# Patient Record
Sex: Female | Born: 1985 | Race: White | Hispanic: No | Marital: Single | Smoking: Never smoker
Health system: Southern US, Community
[De-identification: ages and names within clinical notes are randomized; demographics above are authoritative.]

## PROBLEM LIST (undated history)

## (undated) DIAGNOSIS — M549 Dorsalgia, unspecified: Secondary | ICD-10-CM

## (undated) DIAGNOSIS — F209 Schizophrenia, unspecified: Secondary | ICD-10-CM

## (undated) HISTORY — PX: LUMBAR DISC SURGERY: SHX700

---

## 2013-08-23 ENCOUNTER — Emergency Department (HOSPITAL_COMMUNITY): Payer: Medicaid Other

## 2013-08-23 ENCOUNTER — Encounter (HOSPITAL_COMMUNITY): Payer: Self-pay | Admitting: Emergency Medicine

## 2013-08-23 ENCOUNTER — Emergency Department (HOSPITAL_COMMUNITY)
Admission: EM | Admit: 2013-08-23 | Discharge: 2013-08-23 | Disposition: A | Discharge status: Home / Self Care | Payer: Medicaid Other | Attending: Emergency Medicine | Admitting: Emergency Medicine

## 2013-08-23 DIAGNOSIS — J069 Acute upper respiratory infection, unspecified: Secondary | ICD-10-CM | POA: Insufficient documentation

## 2013-08-23 DIAGNOSIS — Z79899 Other long term (current) drug therapy: Secondary | ICD-10-CM | POA: Insufficient documentation

## 2013-08-23 DIAGNOSIS — F209 Schizophrenia, unspecified: Secondary | ICD-10-CM | POA: Insufficient documentation

## 2013-08-23 HISTORY — DX: Dorsalgia, unspecified: M54.9

## 2013-08-23 HISTORY — DX: Schizophrenia, unspecified: F20.9

## 2013-08-23 MED ORDER — ALBUTEROL SULFATE HFA 108 (90 BASE) MCG/ACT IN AERS
2.0000 | INHALATION_SPRAY | RESPIRATORY_TRACT | Status: DC | PRN
  Administered 2013-08-23: 2 via RESPIRATORY_TRACT
  Filled 2013-08-23: qty 6.7

## 2013-08-23 MED ORDER — ALBUTEROL SULFATE (5 MG/ML) 0.5% IN NEBU
5.0000 mg | INHALATION_SOLUTION | Freq: Once | RESPIRATORY_TRACT | Status: AC
  Administered 2013-08-23: 5 mg via RESPIRATORY_TRACT
  Filled 2013-08-23: qty 1

## 2013-08-23 NOTE — ED Provider Notes (Signed)
CSN: [id number]     Arrival date & time 08/23/13  6644 History   First MD Initiated Contact with Patient 08/23/13 0601     Chief Complaint  Patient presents with  . Cough  . URI   (Consider location/radiation/quality/duration/timing/severity/associated sxs/prior Treatment) HPI Comments: Patient presents to the ED with a chief complaint of cough.  She states that she has been coughing for the past month.  She states that she thinks it is "drying out."  She has not taken anything to alleviate her symptoms.  Nothing makes her symptoms better or worse.  She endorses subjective fevers and chills, but has never recorded a temperature.  Additionally, states that she recently underwent an epidural for a herniated disc.  She states that she has had some discomfort, but thinks that it is her normal pain.  She states that she did see some pink discharge, but this has since stopped.  She denies any bowel or bladder problems.  The history is provided by the patient. No language interpreter was used.    Past Medical History  Diagnosis Date  . Back pain   . Schizophrenia    Past Surgical History  Procedure Laterality Date  . Lumbar disc surgery     Family History  Problem Relation Age of Onset  . Hypertension Other   . Cancer Other    History  Substance Use Topics  . Smoking status: Never Smoker   . Smokeless tobacco: Not on file  . Alcohol Use: No   OB History   Grav Para Term Preterm Abortions TAB SAB Ect Mult Living                 Review of Systems  All other systems reviewed and are negative.    Allergies  Review of patient's allergies indicates no known allergies.  Home Medications   Current Outpatient Rx  Name  Route  Sig  Dispense  Refill  . amphetamine-dextroamphetamine (ADDERALL XR) 20 MG 24 hr capsule   Oral   Take 20 mg by mouth daily.         . ARIPiprazole (ABILIFY) 20 MG tablet   Oral   Take 40 mg by mouth daily.         . benztropine (COGENTIN) 1 MG  tablet   Oral   Take 1 mg by mouth 2 (two) times daily as needed for tremors.         . fentaNYL (DURAGESIC - DOSED MCG/HR) 25 MCG/HR patch   Transdermal   Place 25 mcg onto the skin every 3 (three) days.         Marland Kitchen ibuprofen (ADVIL,MOTRIN) 200 MG tablet   Oral   Take 200 mg by mouth every 6 (six) hours as needed.         . pseudoephedrine-guaifenesin (MUCINEX D) 60-600 MG per tablet   Oral   Take 1 tablet by mouth every 12 (twelve) hours.         . sertraline (ZOLOFT) 100 MG tablet   Oral   Take 100 mg by mouth daily.          BP 141/83  Pulse 101  Temp(Src) 99.2 F (37.3 C) (Oral)  Resp 16  SpO2 98%  LMP 07/29/2013 Physical Exam  Nursing note and vitals reviewed. Constitutional: She is oriented to person, place, and time. She appears well-developed and well-nourished.  HENT:  Head: Normocephalic and atraumatic.  Eyes: Conjunctivae and EOM are normal. Pupils are equal, round, and reactive  to light.  Neck: Normal range of motion. Neck supple.  Cardiovascular: Normal rate and regular rhythm.  Exam reveals no gallop and no friction rub.   No murmur heard. Pulmonary/Chest: Effort normal and breath sounds normal. No respiratory distress. She has no wheezes. She has no rales. She exhibits no tenderness.  Abdominal: Soft. Bowel sounds are normal. She exhibits no distension and no mass. There is no tenderness. There is no rebound and no guarding.  Musculoskeletal: Normal range of motion. She exhibits no edema and no tenderness.  Lumbar spine mildly tender to palpation, no bony step offs or deformities  Neurological: She is alert and oriented to person, place, and time.  Skin: Skin is warm and dry.  Lumbar spine is unremarkable, no erythema, ecchymosis, or evidence of infection  Psychiatric: She has a normal mood and affect. Her behavior is normal. Judgment and thought content normal.    ED Course  Procedures (including critical care time) Labs Review Labs Reviewed  - No data to display Imaging Review Dg Chest 2 View  08/23/2013   CLINICAL DATA:  Cough and upper respiratory infection.  EXAM: CHEST  2 VIEW  COMPARISON:  None.  FINDINGS: The heart size and mediastinal contours are within normal limits. Both lungs are clear. The visualized skeletal structures are unremarkable.  IMPRESSION: No active cardiopulmonary disease.   Electronically Signed   By: Tiburcio Pea M.D.   On: 08/23/2013 06:37    EKG Interpretation   None       MDM   1. URI (upper respiratory infection)    Pt CXR negative for acute infiltrate. Patients symptoms are consistent with URI, likely viral etiology. Discussed that antibiotics are not indicated for viral infections. Pt will be discharged with symptomatic treatment.  Verbalizes understanding and is agreeable with plan. Pt is hemodynamically stable & in NAD prior to dc.  No evidence of epidural abscess.  Suspect that the tenderness is related to the recent injection, but nothing more.  No headache, fever, or paresthesias.  Patient discussed with Dr. Effie Shy, who agrees with the plan.   Roxy Horseman, PA-C 08/23/13 [phone]

## 2013-08-23 NOTE — ED Notes (Signed)
Pt states she has not been feeling well for about a month  Pt states she has a cough, congestion, fever off and on, and had vomiting twice last week  Pt states cough is worse tonight

## 2013-08-23 NOTE — ED Provider Notes (Signed)
Medical screening examination/treatment/procedure(s) were performed by non-physician practitioner and as supervising physician I was immediately available for consultation/collaboration.  Denim Kalmbach L Javen Ridings, MD 08/23/13 1713 

## 2014-09-21 ENCOUNTER — Encounter (HOSPITAL_COMMUNITY): Payer: Self-pay

## 2014-09-21 ENCOUNTER — Emergency Department (HOSPITAL_COMMUNITY)
Admission: EM | Admit: 2014-09-21 | Discharge: 2014-09-21 | Disposition: A | Discharge status: Home / Self Care | Payer: Medicaid Other | Attending: Emergency Medicine | Admitting: Emergency Medicine

## 2014-09-21 ENCOUNTER — Emergency Department (HOSPITAL_COMMUNITY): Payer: Medicaid Other

## 2014-09-21 DIAGNOSIS — R079 Chest pain, unspecified: Secondary | ICD-10-CM | POA: Diagnosis not present

## 2014-09-21 DIAGNOSIS — F209 Schizophrenia, unspecified: Secondary | ICD-10-CM | POA: Diagnosis not present

## 2014-09-21 DIAGNOSIS — G8929 Other chronic pain: Secondary | ICD-10-CM | POA: Insufficient documentation

## 2014-09-21 DIAGNOSIS — J45901 Unspecified asthma with (acute) exacerbation: Secondary | ICD-10-CM | POA: Diagnosis not present

## 2014-09-21 DIAGNOSIS — Z79899 Other long term (current) drug therapy: Secondary | ICD-10-CM | POA: Insufficient documentation

## 2014-09-21 DIAGNOSIS — J069 Acute upper respiratory infection, unspecified: Secondary | ICD-10-CM | POA: Insufficient documentation

## 2014-09-21 DIAGNOSIS — R0602 Shortness of breath: Secondary | ICD-10-CM | POA: Diagnosis present

## 2014-09-21 LAB — BASIC METABOLIC PANEL
ANION GAP: 7 (ref 5–15)
BUN: 10 mg/dL (ref 6–23)
CALCIUM: 9.4 mg/dL (ref 8.4–10.5)
CO2: 23 mmol/L (ref 19–32)
Chloride: 108 mEq/L (ref 96–112)
Creatinine, Ser: 0.62 mg/dL (ref 0.50–1.10)
GFR calc Af Amer: >90 mL/min (ref >90)
GFR calc non Af Amer: >90 mL/min (ref >90)
Glucose, Bld: 91 mg/dL (ref 70–99)
POTASSIUM: 4 mmol/L (ref 3.5–5.1)
Sodium: 138 mmol/L (ref 135–145)

## 2014-09-21 LAB — CBC
HEMATOCRIT: 40.7 % (ref 36.0–46.0)
HEMOGLOBIN: 14 g/dL (ref 12.0–15.0)
MCH: 28.8 pg (ref 26.0–34.0)
MCHC: 34.4 g/dL (ref 30.0–36.0)
MCV: 83.7 fL (ref 78.0–100.0)
PLATELETS: 344 10*3/uL (ref 150–400)
RBC: 4.86 MIL/uL (ref 3.87–5.11)
RDW: 13.1 % (ref 11.5–15.5)
WBC: 5.7 10*3/uL (ref 4.0–10.5)

## 2014-09-21 LAB — I-STAT TROPONIN, ED: TROPONIN I, POC: 0 ng/mL (ref 0.00–0.08)

## 2014-09-21 LAB — D-DIMER, QUANTITATIVE (NOT AT ARMC)

## 2014-09-21 MED ORDER — PREDNISONE 20 MG PO TABS: 40.0000 mg | ORAL_TABLET | Freq: Every day | ORAL | Status: AC

## 2014-09-21 MED ORDER — IPRATROPIUM BROMIDE 0.02 % IN SOLN
0.5000 mg | Freq: Once | RESPIRATORY_TRACT | Status: AC
  Administered 2014-09-21: 0.5 mg via RESPIRATORY_TRACT
  Filled 2014-09-21: qty 2.5

## 2014-09-21 MED ORDER — ALBUTEROL SULFATE (2.5 MG/3ML) 0.083% IN NEBU
5.0000 mg | INHALATION_SOLUTION | Freq: Once | RESPIRATORY_TRACT | Status: AC
  Administered 2014-09-21: 5 mg via RESPIRATORY_TRACT
  Filled 2014-09-21: qty 6

## 2014-09-21 NOTE — ED Provider Notes (Addendum)
CSN: [id number]     Arrival date & time 09/21/14  1253 History   First MD Initiated Contact with Patient 09/21/14 1502     Chief Complaint  Patient presents with  . Shortness of Breath     (Consider location/radiation/quality/duration/timing/severity/associated sxs/prior Treatment) Patient is a 29 y.o. female presenting with shortness of breath. The history is provided by the patient.  Shortness of Breath Severity:  Moderate Onset quality:  Gradual Duration:  4 days Timing:  Intermittent Progression:  Worsening Chronicity:  New Context: URI   Context comment:  Patient had cough cold and congestion since Christmas and noticed 4 days ago she started to get short of breath with any activity Relieved by:  Rest and sitting up Worsened by:  Activity and exertion (Lying down) Associated symptoms: chest pain, cough, headaches and sputum production   Associated symptoms: no abdominal pain, no fever, no vomiting and no wheezing   Associated symptoms comment:  Chills and night sweats Risk factors: oral contraceptive use   Risk factors: no hx of PE/DVT, no obesity, no prolonged immobilization, no recent surgery and no tobacco use     Past Medical History  Diagnosis Date  . Back pain   . Schizophrenia    Past Surgical History  Procedure Laterality Date  . Lumbar disc surgery     Family History  Problem Relation Age of Onset  . Hypertension Other   . Cancer Other    History  Substance Use Topics  . Smoking status: Never Smoker   . Smokeless tobacco: Not on file  . Alcohol Use: No   OB History    No data available     Review of Systems  Constitutional: Negative for fever.  HENT: Positive for congestion and sinus pressure.   Respiratory: Positive for cough, sputum production and shortness of breath. Negative for wheezing.   Cardiovascular: Positive for chest pain.  Gastrointestinal: Negative for vomiting and abdominal pain.  Neurological: Positive for headaches.       Over  the last week her migraines have been bothering her more so on the left side since she's been sick.  All other systems reviewed and are negative.     Allergies  Norco  Home Medications   Prior to Admission medications   Medication Sig Start Date End Date Taking? Authorizing Provider  acetaminophen-codeine (TYLENOL #4) 300-60 MG per tablet Take 1 tablet by mouth every 8 (eight) hours as needed for moderate pain or severe pain.   Yes Historical Provider, MD  ARIPiprazole (ABILIFY) 20 MG tablet Take 40 mg by mouth daily.   Yes Historical Provider, MD  benztropine (COGENTIN) 1 MG tablet Take 1 mg by mouth 2 (two) times daily as needed for tremors.   Yes Historical Provider, MD  ibuprofen (ADVIL,MOTRIN) 200 MG tablet Take 200 mg by mouth every 6 (six) hours as needed for headache or moderate pain.    Yes Historical Provider, MD  medroxyPROGESTERone (DEPO-PROVERA) 150 MG/ML injection Inject 150 mg into the muscle every 3 (three) months.   Yes Historical Provider, MD  pseudoephedrine-guaifenesin (MUCINEX D) 60-600 MG per tablet Take 1 tablet by mouth every 12 (twelve) hours.   Yes Historical Provider, MD  venlafaxine XR (EFFEXOR-XR) 150 MG 24 hr capsule Take 300 mg by mouth daily with breakfast.   Yes Historical Provider, MD   BP 147/92 mmHg  Pulse 100  Temp(Src) 97.7 F (36.5 C) (Oral)  Resp 18  SpO2 100%  LMP 06/30/2014 (Approximate) Physical Exam  Constitutional:  She is oriented to person, place, and time. She appears well-developed and well-nourished. No distress.  HENT:  Head: Normocephalic and atraumatic.  Right Ear: Tympanic membrane and ear canal normal.  Left Ear: Tympanic membrane and ear canal normal.  Mouth/Throat: Mucous membranes are normal. No oropharyngeal exudate, posterior oropharyngeal edema or posterior oropharyngeal erythema.  Eyes: EOM are normal. Pupils are equal, round, and reactive to light.  Cardiovascular: Normal rate, regular rhythm, normal heart sounds and  intact distal pulses.  Exam reveals no friction rub.   No murmur heard. Pulmonary/Chest: Effort normal. She has decreased breath sounds. She has no wheezes. She has no rales.  Abdominal: Soft. Bowel sounds are normal. She exhibits no distension. There is no tenderness. There is no rebound and no guarding.  Musculoskeletal: Normal range of motion. She exhibits no tenderness.  No edema  Neurological: She is alert and oriented to person, place, and time. No cranial nerve deficit.  Skin: Skin is warm and dry. No rash noted.  Psychiatric: She has a normal mood and affect. Her behavior is normal.  Nursing note and vitals reviewed.   ED Course  Procedures (including critical care time) Labs Review Labs Reviewed  CBC  BASIC METABOLIC PANEL  D-DIMER, QUANTITATIVE  I-STAT TROPOININ, ED    Imaging Review Dg Chest 2 View  09/21/2014   CLINICAL DATA:  Shortness of breath for 4 days, has a "stitch"/discomfort in her side bilaterally  EXAM: CHEST  2 VIEW  COMPARISON:  08/23/2013  FINDINGS: Normal heart size, mediastinal contours, and pulmonary vascularity.  Lungs clear.  No pneumothorax.  Bones unremarkable.  Specifically, lower ribs unremarkable.  IMPRESSION: Normal exam.   Electronically Signed   By: Ulyses Southward M.D.   On: 09/21/2014 15:55     EKG Interpretation   Date/Time:  Saturday September 21 2014 13:22:21 EST Ventricular Rate:  88 PR Interval:  100 QRS Duration: 79 QT Interval:  336 QTC Calculation: 406 R Axis:   81 Text Interpretation:  Sinus rhythm Short PR interval Borderline T wave  abnormalities Baseline wander in lead(s) II III aVF No previous tracing  Confirmed by Anitra Lauth  MD, Alphonzo Lemmings ([id number]) on 09/21/2014 3:24:34 PM      MDM   Final diagnoses:  SOB (shortness of breath)  URI (upper respiratory infection)    Patient with a prior history of schizophrenia and chronic pain who presents today with multiple complaints. Including URI symptoms for the last 3 weeks which has  now developed into shortness of breath over the last 4 days. Denies any productive cough at this time. No distal edema, prior history of cardiac issues. She does have a prior history of asthma as a kid but has not used an inhaler for some time. No tobacco use or exposure. Symptoms are worse with lying down and walking. She denies any consistent chest pain but when she does get pain it's a sharp spasm that's worse with breathing. She has no history of PE in the past and her only risk factor is tachycardia here. She is low risk Wells will do a d-dimer.  EKG was sinus rhythm and shortened PR interval without other findings.  Chest x-ray, CBC, BMP, d-dimer pending. Patient given albuterol  5:10 PM Imaging and lab testing is all within normal limits. Patient says she feels slightly better after the albuterol. We'll also give her a course of steroids. This time do not feel that patient needs antibiotics. Findings were discussed with patient and her significant other. She was  discharged home to follow up with PCP in one week if not improved  Gwyneth Sprout, MD 09/21/14 1711  Gwyneth Sprout, MD 09/21/14 1712

## 2014-09-21 NOTE — ED Notes (Signed)
She c/o shortness of breath and "stitches in my sides" (points at bilat. Lower ribs areas); x ~ 4 days.  She also mentions cough productive of sm. Amts. Green phlegm.  She is in no distress.  She states these s/s are made worse by lying down.

## 2014-09-21 NOTE — ED Notes (Signed)
Patient transported to X-ray 

## 2016-06-05 IMAGING — CR DG CHEST 2V
2 series · 2 of 2 positions shown · non-contrast
Comparison: 08/23/2013

CLINICAL DATA: Shortness of breath for 4 days, has a
"stitch"/discomfort in her side bilaterally

EXAM:
CHEST  2 VIEW

[w chest pa]
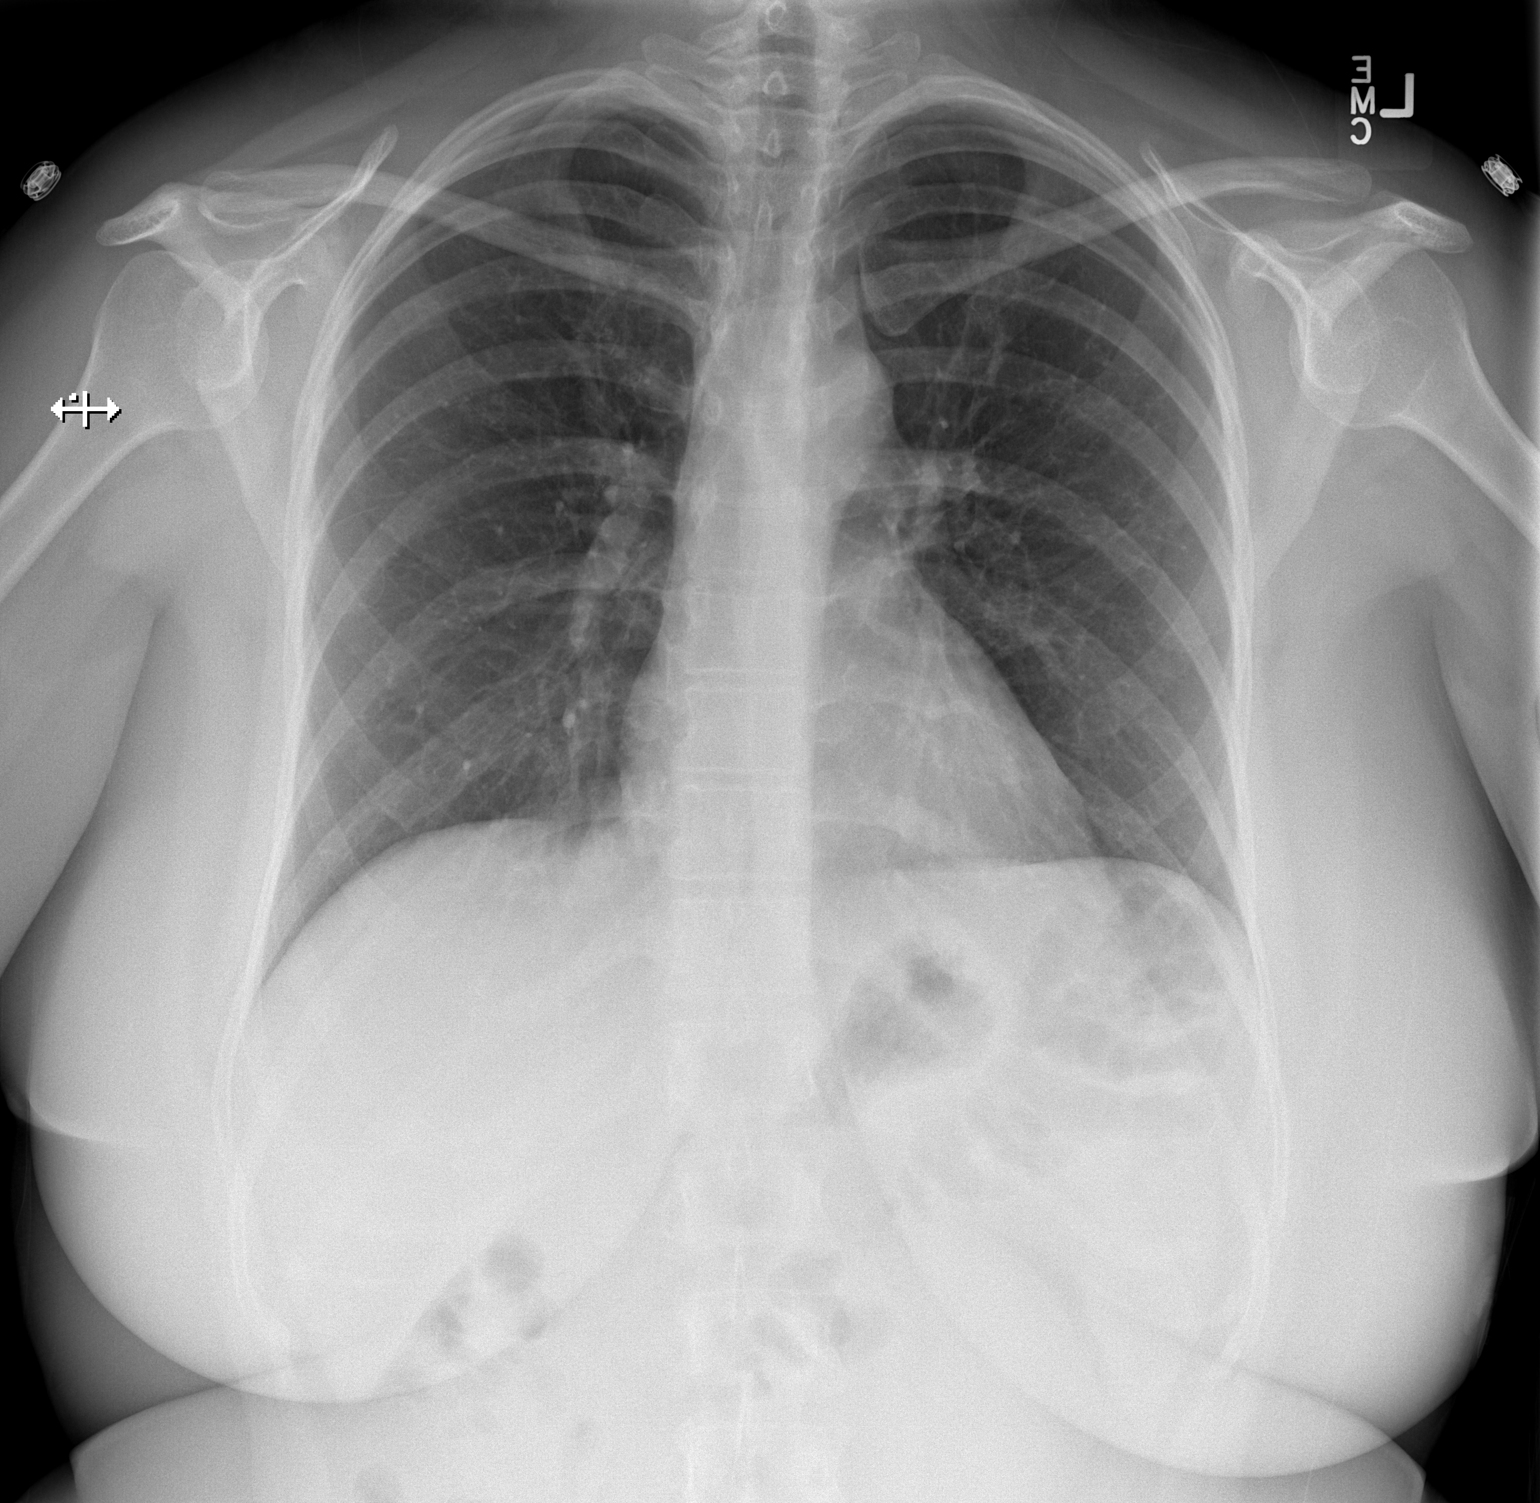

[w chest lat]
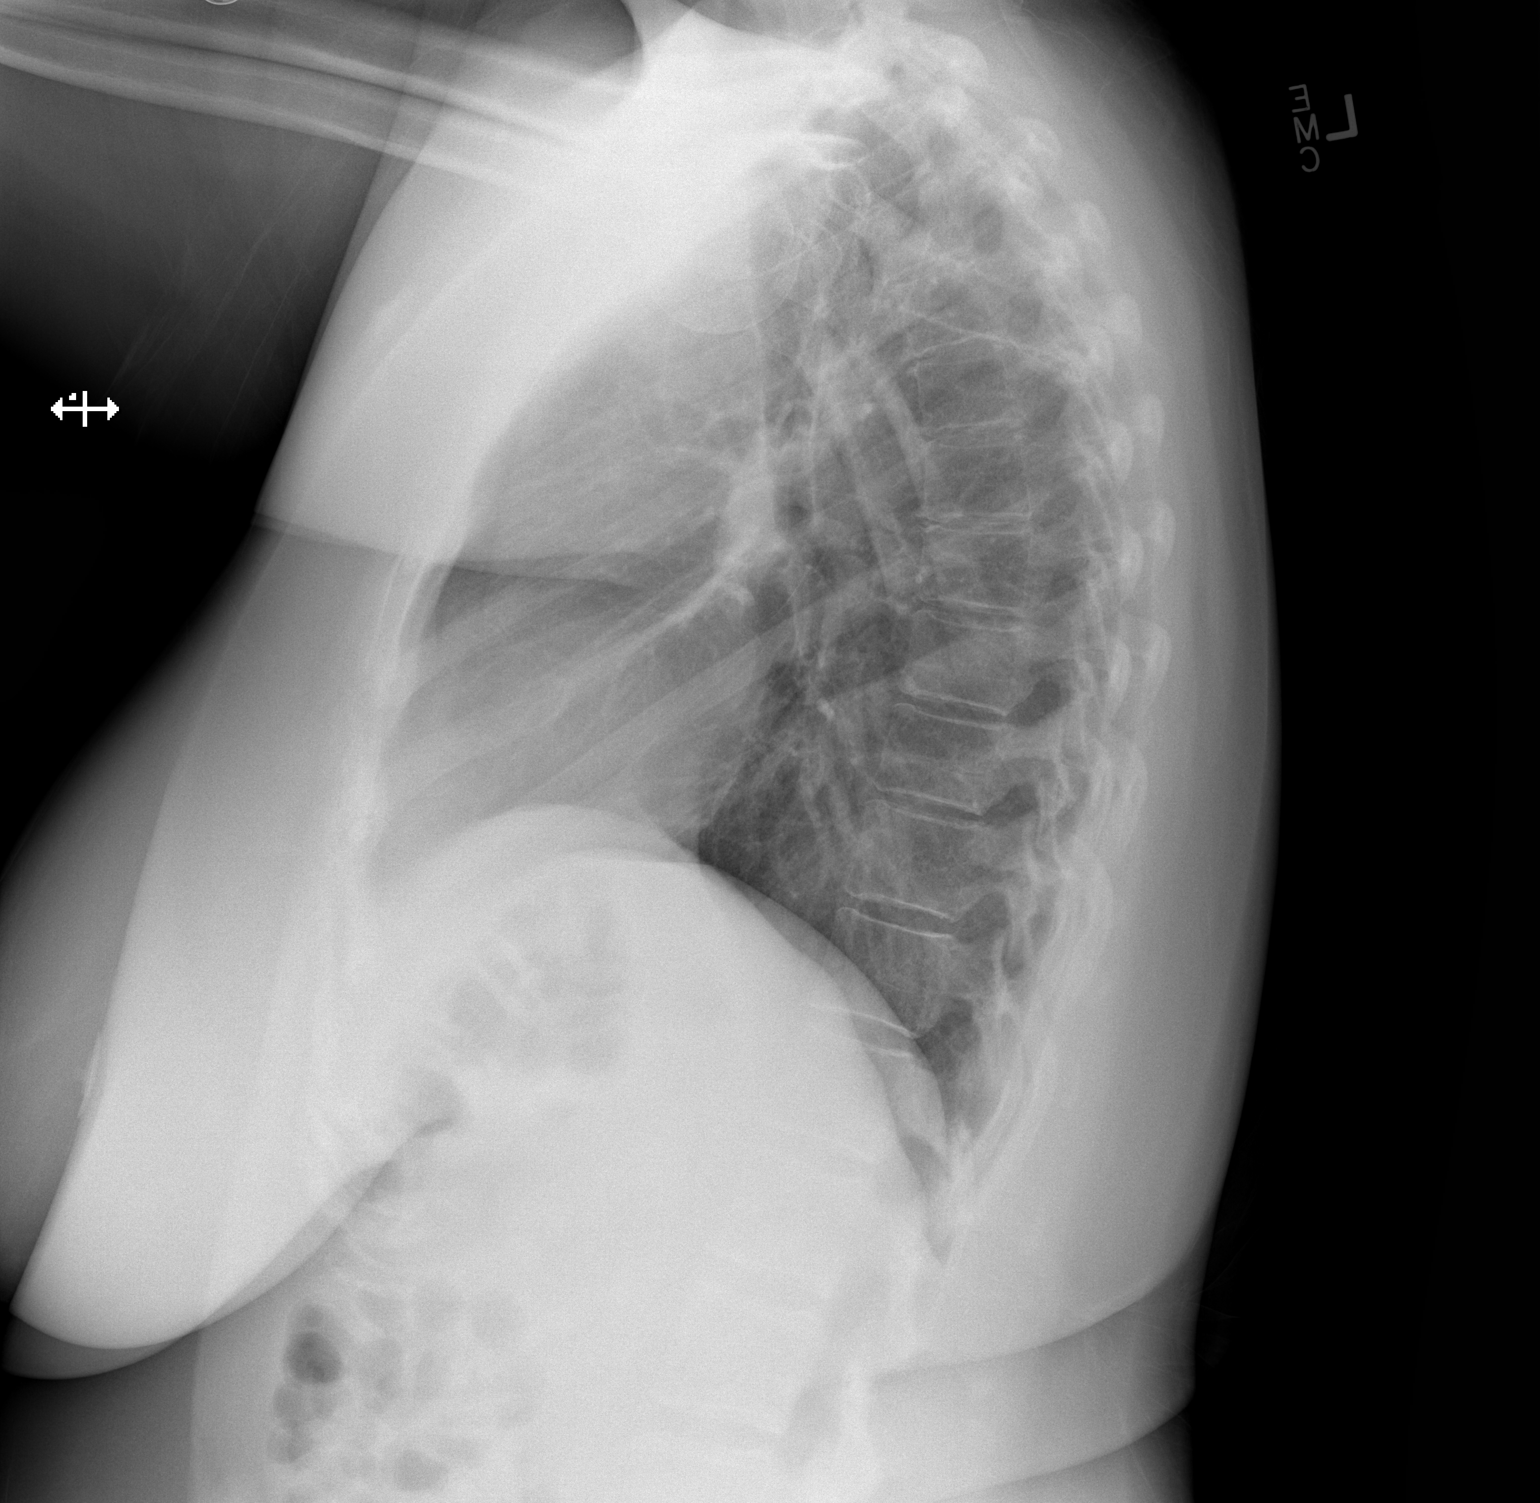

[2 of 2 positions shown; findings below may reference images not displayed]

FINDINGS: Normal heart size, mediastinal contours, and pulmonary vascularity.

Lungs clear.

No pneumothorax.

Bones unremarkable.

Specifically, lower ribs unremarkable.
IMPRESSION: Normal exam.
